# Patient Record
Sex: Male | Born: 1990 | Race: Black or African American | Hispanic: No | Marital: Single | Smoking: Current every day smoker
Health system: Southern US, Community
[De-identification: ages and names within clinical notes are randomized; demographics above are authoritative.]

---

## 2018-03-21 ENCOUNTER — Emergency Department (HOSPITAL_COMMUNITY): Payer: Self-pay

## 2018-03-21 ENCOUNTER — Emergency Department (HOSPITAL_COMMUNITY)
Admission: EM | Admit: 2018-03-21 | Discharge: 2018-03-21 | Disposition: A | Discharge status: Home / Self Care | Payer: Self-pay | Attending: Emergency Medicine | Admitting: Emergency Medicine

## 2018-03-21 ENCOUNTER — Encounter (HOSPITAL_COMMUNITY): Payer: Self-pay | Admitting: Emergency Medicine

## 2018-03-21 DIAGNOSIS — F17200 Nicotine dependence, unspecified, uncomplicated: Secondary | ICD-10-CM | POA: Insufficient documentation

## 2018-03-21 DIAGNOSIS — R05 Cough: Secondary | ICD-10-CM | POA: Insufficient documentation

## 2018-03-21 DIAGNOSIS — R059 Cough, unspecified: Secondary | ICD-10-CM

## 2018-03-21 MED ORDER — IBUPROFEN 400 MG PO TABS
600.0000 mg | ORAL_TABLET | Freq: Once | ORAL | Status: AC
  Administered 2018-03-21: 600 mg via ORAL
  Filled 2018-03-21: qty 1

## 2018-03-21 MED ORDER — BENZONATATE 100 MG PO CAPS: 100.0000 mg | ORAL_CAPSULE | Freq: Three times a day (TID) | ORAL | 0 refills | Status: AC

## 2018-03-21 NOTE — ED Notes (Signed)
Patient transported to x-ray. ?

## 2018-03-21 NOTE — Discharge Instructions (Addendum)
Your symptoms are likely consistent with a viral illness. Viruses do not require or respond to antibiotics. Treatment is symptomatic care and it is important to note that these symptoms may last for 7-14 days.  ° °Hand washing: Loy your hands throughout the day, but especially before and after touching the face, using the restroom, sneezing, coughing, or touching surfaces that have been coughed or sneezed upon. °Hydration: Symptoms of most illnesses will be intensified and complicated by dehydration. Dehydration can also extend the duration of symptoms. Drink plenty of fluids and get plenty of rest. You should be drinking at least half a liter of water an hour to stay hydrated. Electrolyte drinks (ex. Gatorade, Powerade, Pedialyte) are also encouraged. You should be drinking enough fluids to make your urine light yellow, almost clear. If this is not the case, you are not drinking enough water. °Please note that some of the treatments indicated below will not be effective if you are not adequately hydrated. °Pain or fever: Ibuprofen, Naproxen, or acetaminophen (generic for Tylenol) for pain or fever.  °Antiinflammatory medications: Take 600 mg of ibuprofen every 6 hours or 440 mg (over the counter dose) to 500 mg (prescription dose) of naproxen every 12 hours for the next 3 days. After this time, these medications may be used as needed for pain. Take these medications with food to avoid upset stomach. Choose only one of these medications, do not take them together. °Acetaminophen (generic for Tylenol): Should you continue to have additional pain while taking the ibuprofen or naproxen, you may add in acetaminophen as needed. Your daily total maximum amount of acetaminophen from all sources should be limited to 4000mg/day for persons without liver problems, or 2000mg/day for those with liver problems. °Cough: Use the benzonatate (generic for Tessalon) for cough.  °Teas, warm liquids, broths, and honey can also help  with cough. °Zyrtec or Claritin: May add these medication daily to control underlying symptoms of congestion, sneezing, and other signs of allergies.  These medications are available over-the-counter. Generics: Cetirizine (generic for Zyrtec) and loratadine (generic for Claritin). °Fluticasone: Use fluticasone (generic for Flonase), as directed, for nasal and sinus congestion.  This medication is available over-the-counter. °Congestion: Plain guaifenesin (generic for plain Mucinex) may help relieve congestion. Saline sinus rinses and saline nasal sprays may also help relieve congestion. If you do not have high blood pressure, heart problems, or an allergy to such medications, you may also try phenylephrine or Sudafed. °Sore throat: Warm liquids or Chloraseptic spray may help soothe a sore throat. Gargle twice a day with a salt water solution made from a half teaspoon of salt in a cup of warm water.  °Follow up: Follow up with a primary care provider within the next two weeks should symptoms fail to resolve. °Return: Return to the ED for significantly worsening symptoms, shortness of breath, persistent vomiting, large amounts of blood in stool, or any other major concerns. ° °For prescription assistance, may try using prescription discount sites or apps, such as goodrx.com °

## 2018-03-21 NOTE — ED Triage Notes (Addendum)
Pt reports sudden cough x 2 days with clear mucous. Reports feeling like he has a fever but hasn't checked it. Denies SOB.

## 2018-03-21 NOTE — ED Provider Notes (Signed)
[facility] EMERGENCY DEPARTMENT Provider Note   CSN: [id number] Arrival date & time: 03/21/18  2100    History   Chief Complaint Chief Complaint  Patient presents with  . Cough    HPI Dex Ganschow is a 28 y.o. male.     HPI   Jadakiss Kump is a 29 y.o. male, patient with no pertinent past medical history, presenting to the ED with cough for the last 2 days.  Productive with clear sputum.  Subjective fever.  Has tried NyQuil for his symptoms. Denies shortness of breath, N/V/D, chest pain, abdominal pain, rash, sore throat, upper respiratory congestion, hemoptysis, or any other complaints.   History reviewed. No pertinent past medical history.  There are no active problems to display for this patient.   History reviewed. No pertinent surgical history.      Home Medications    Prior to Admission medications   Medication Sig Start Date End Date Taking? Authorizing Provider  benzonatate (TESSALON) 100 MG capsule Take 1 capsule (100 mg total) by mouth every 8 (eight) hours. 03/21/18   , Hillard Danker, PA-C    Family History History reviewed. No pertinent family history.  Social History Social History   Tobacco Use  . Smoking status: Current Every Day Smoker    Types: E-cigarettes  . Smokeless tobacco: Never Used  . Tobacco comment: vapes  Substance Use Topics  . Alcohol use: Not Currently  . Drug use: Never     Allergies   Patient has no allergy information on record.   Review of Systems Review of Systems  Constitutional: Positive for fever.  HENT: Negative for congestion, rhinorrhea, sore throat and trouble swallowing.   Respiratory: Positive for cough. Negative for shortness of breath.   Cardiovascular: Negative for chest pain.  Gastrointestinal: Negative for abdominal pain, diarrhea, nausea and vomiting.  Musculoskeletal: Negative for neck pain and neck stiffness.  All other systems reviewed and are negative.    Physical Exam Updated  Vital Signs BP 127/83   Pulse (!) 110   Temp (!) 102.3 F (39.1 C)   Resp 16   SpO2 97%   Physical Exam Vitals signs and nursing note reviewed.  Constitutional:      General: He is not in acute distress.    Appearance: He is well-developed. He is not diaphoretic.  HENT:     Head: Normocephalic and atraumatic.     Mouth/Throat:     Mouth: Mucous membranes are moist.     Pharynx: Oropharynx is clear.  Eyes:     Conjunctiva/sclera: Conjunctivae normal.  Neck:     Musculoskeletal: Neck supple.  Cardiovascular:     Rate and Rhythm: Normal rate and regular rhythm.     Pulses: Normal pulses.     Heart sounds: Normal heart sounds.     Comments: Pulse 100 on my exam. Pulmonary:     Effort: Pulmonary effort is normal. No respiratory distress.     Breath sounds: Normal breath sounds.     Comments: No increased work of breathing.  Speaks in full sentences without difficulty. Abdominal:     Palpations: Abdomen is soft.     Tenderness: There is no abdominal tenderness. There is no guarding.  Musculoskeletal:     Right lower leg: No edema.     Left lower leg: No edema.  Lymphadenopathy:     Cervical: No cervical adenopathy.  Skin:    General: Skin is warm and dry.  Neurological:  Mental Status: He is alert.  Psychiatric:        Mood and Affect: Mood and affect normal.        Speech: Speech normal.        Behavior: Behavior normal.      ED Treatments / Results  Labs (all labs ordered are listed, but only abnormal results are displayed) Labs Reviewed - No data to display  EKG None  Radiology Dg Chest 2 View  Result Date: 03/21/2018 CLINICAL DATA:  Cough and fever EXAM: CHEST - 2 VIEW COMPARISON:  None. FINDINGS: The heart size and mediastinal contours are within normal limits. Both lungs are clear. The visualized skeletal structures are unremarkable. IMPRESSION: No active cardiopulmonary disease. Electronically Signed   By: Jasmine Pang M.D.   On: 03/21/2018 22:16     Procedures Procedures (including critical care time)  Medications Ordered in ED Medications  ibuprofen (ADVIL,MOTRIN) tablet 600 mg (600 mg Oral Given 03/21/18 2144)     Initial Impression / Assessment and Plan / ED Course  I have reviewed the triage vital signs and the nursing notes.  Pertinent labs & imaging results that were available during my care of the patient were reviewed by me and considered in my medical decision making (see chart for details).        Patient presents with a cough.  Noted to be febrile here in the ED. Patient is nontoxic appearing, not tachypneic, not hypotensive, maintains excellent SPO2 on room air, and is in no apparent distress.  No acute abnormality on chest x-ray. The patient was given instructions for home care as well as return precautions. Patient voices understanding of these instructions, accepts the plan, and is comfortable with discharge.  Final Clinical Impressions(s) / ED Diagnoses   Final diagnoses:  Cough    ED Discharge Orders         Ordered    benzonatate (TESSALON) 100 MG capsule  Every 8 hours     03/21/18 2227           Concepcion Living 03/21/18 2239    Margarita Grizzle, MD 03/22/18 [phone]

## 2020-10-09 IMAGING — DX DG CHEST 2V
2 series · 2 of 2 positions shown · non-contrast
Comparison: None.

CLINICAL DATA: Cough and fever

EXAM:
CHEST - 2 VIEW

[w chest pa]
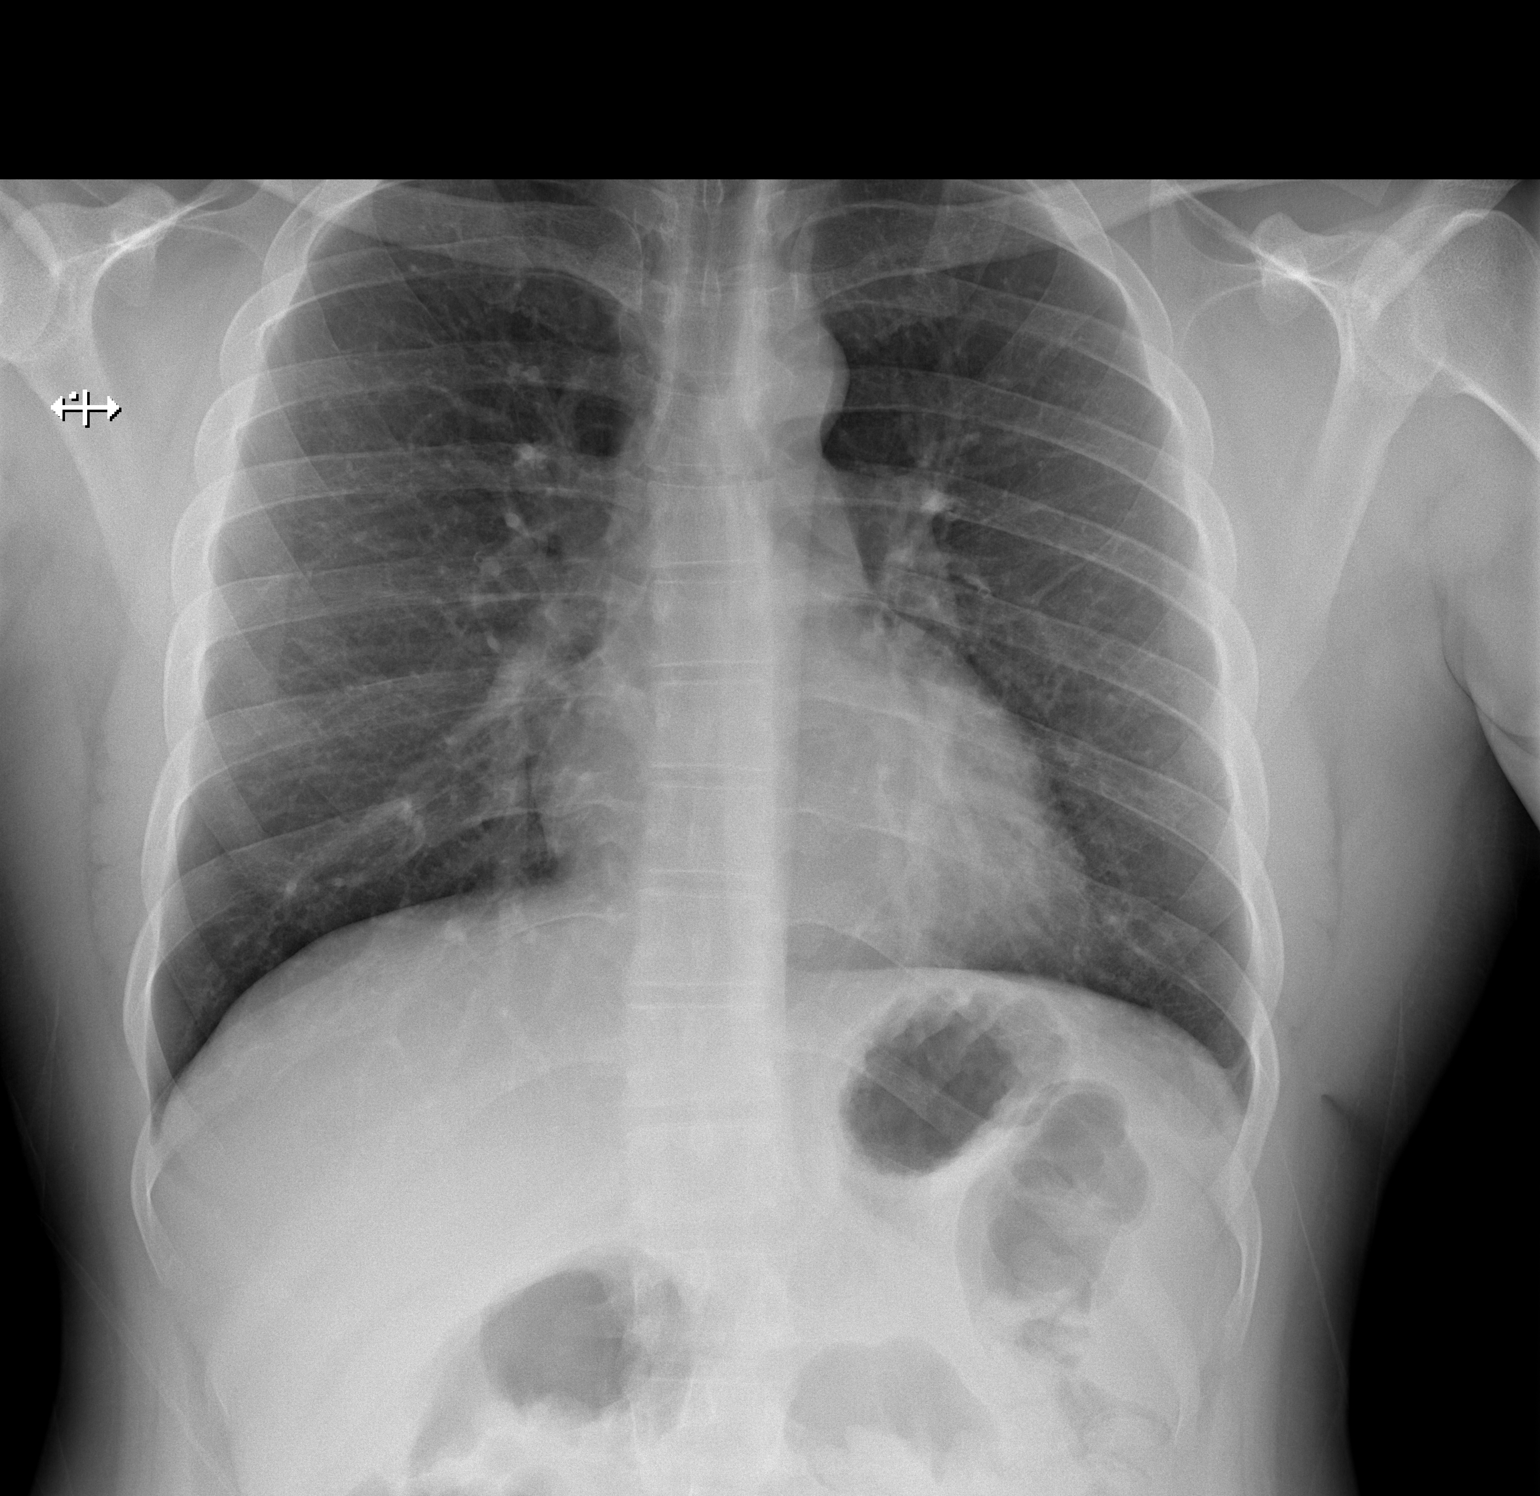

[w chest lat]
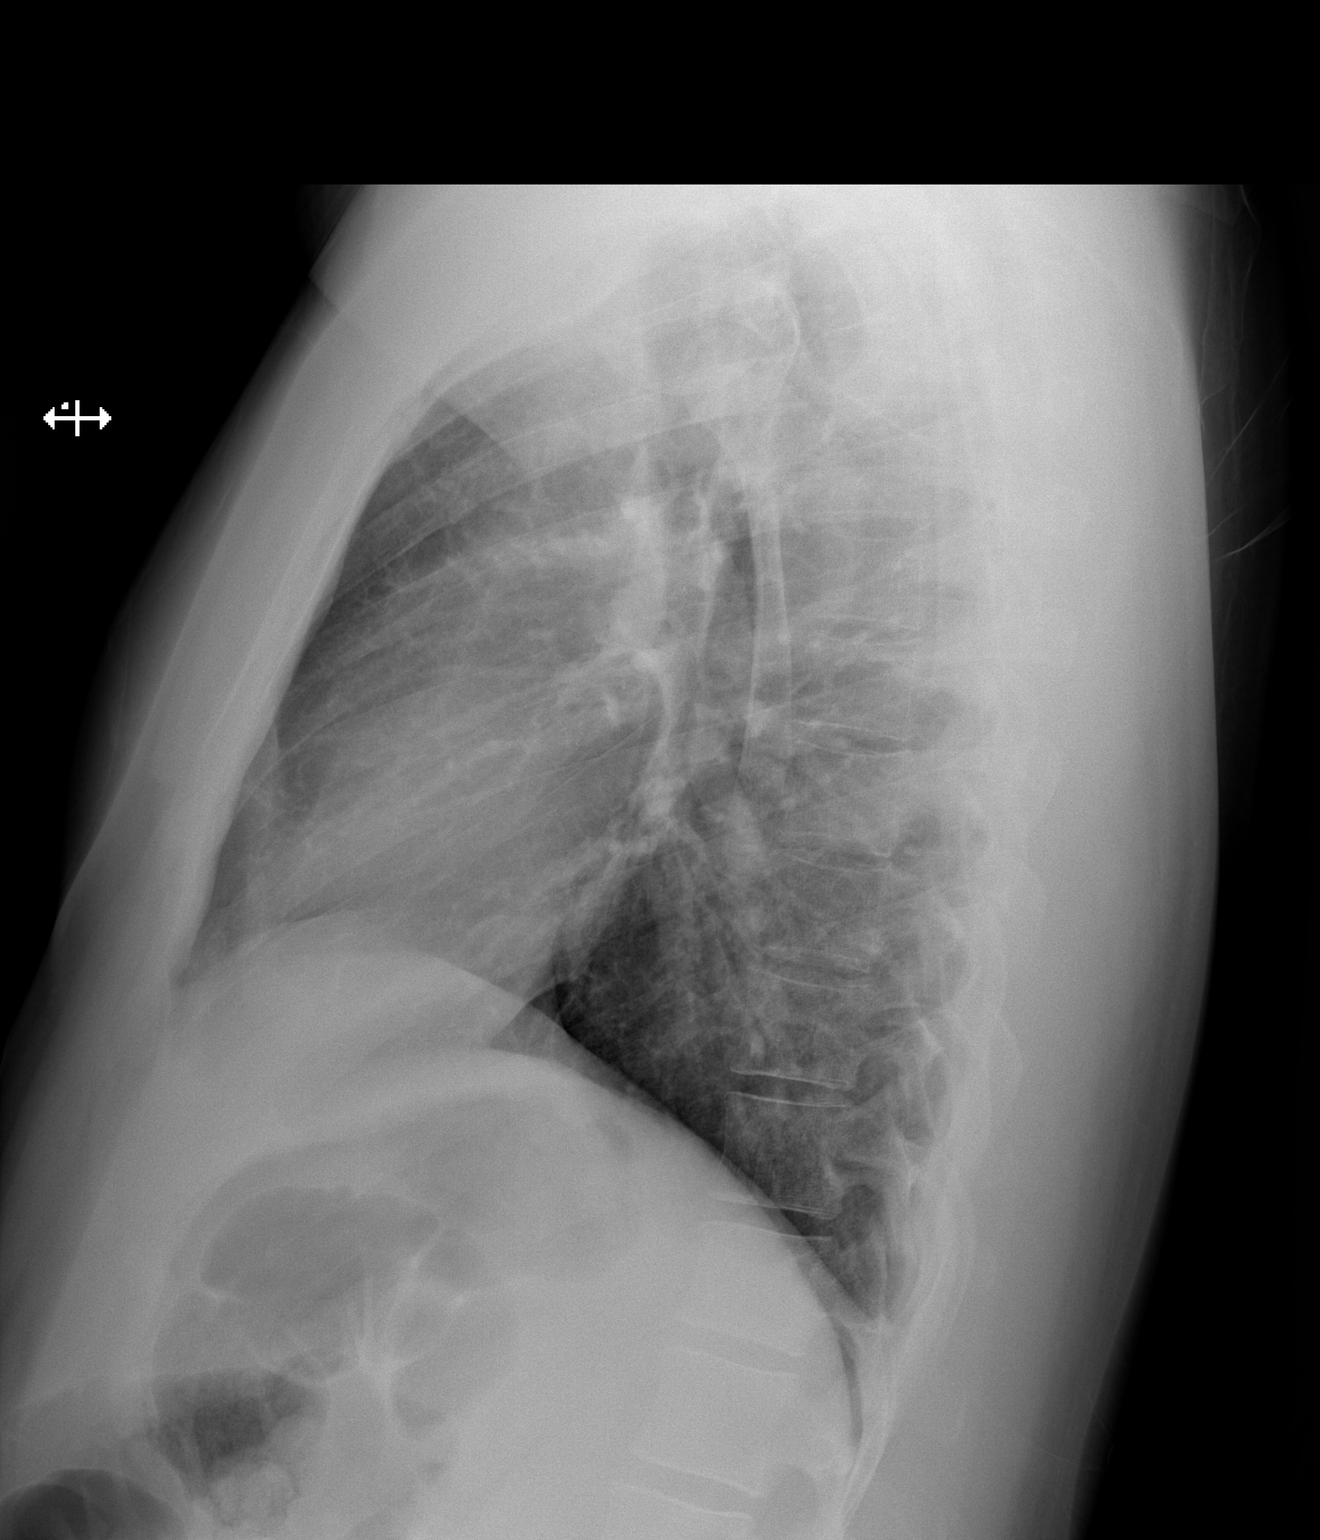

[2 of 2 positions shown; findings below may reference images not displayed]

FINDINGS: The heart size and mediastinal contours are within normal limits.
Both lungs are clear. The visualized skeletal structures are
unremarkable.
IMPRESSION: No active cardiopulmonary disease.
